# Patient Record
Sex: Female | Born: 1980 | Race: White | Hispanic: No | Marital: Married
Health system: Southern US, Community
[De-identification: ages and names within clinical notes are randomized; demographics above are authoritative.]

---

## 2006-11-14 ENCOUNTER — Emergency Department (HOSPITAL_COMMUNITY): Admission: EM | Admit: 2006-11-14 | Discharge: 2006-11-14 | Payer: Self-pay | Admitting: Emergency Medicine

## 2008-04-21 ENCOUNTER — Emergency Department (HOSPITAL_COMMUNITY): Admission: EM | Admit: 2008-04-21 | Discharge: 2008-04-22 | Payer: Self-pay | Admitting: Emergency Medicine

## 2009-09-03 IMAGING — CT CT HEAD W/O CM
1 series · 16 of 30 positions shown, 20 images · non-contrast
Comparison: 11/14/2006.

CLINICAL DATA: 27-year-old female who fell 3 days ago with positive
loss of consciousness.  Ataxia and headache ever since.

CT HEAD WITHOUT CONTRAST
TECHNIQUE: Contiguous axial images were obtained from the base of
the skull through the vertex without contrast.

[Series 2: headtrauma 4.8 h37s · axial · 0.43mm/px · z∈[+1146,+1276]mm · 16 of 30 slices shown, 20 images]
[im 2/30  brain]
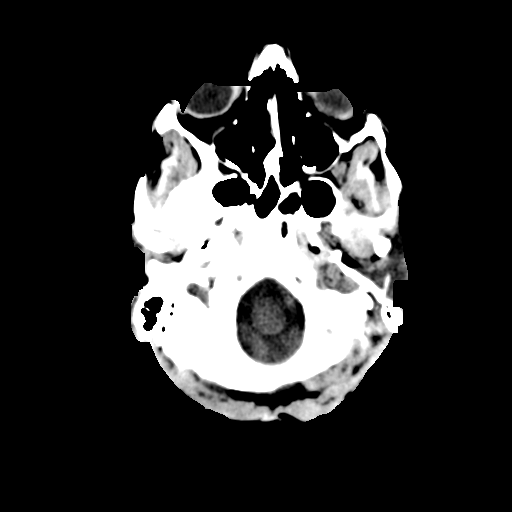
[im 2/30  bone]
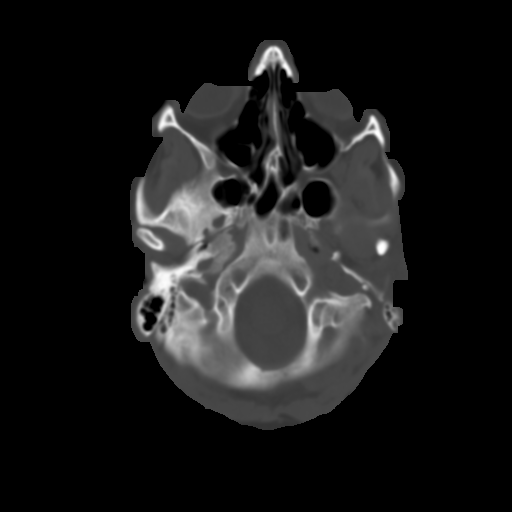
[im 4/30  brain]
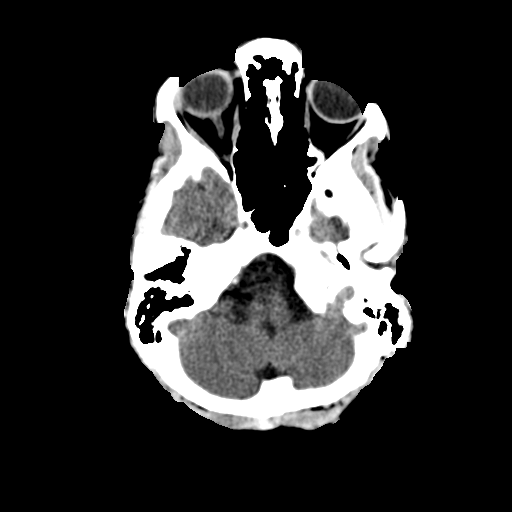
[im 6/30  brain]
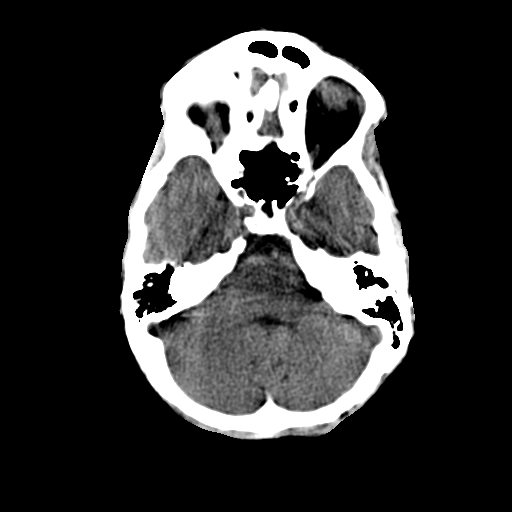
[im 8/30  brain]
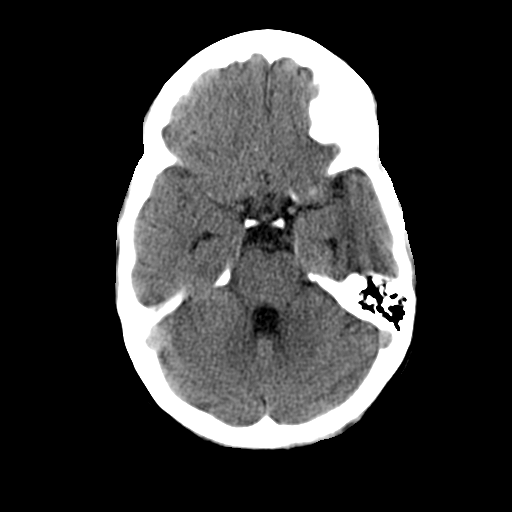
[im 9/30  brain]
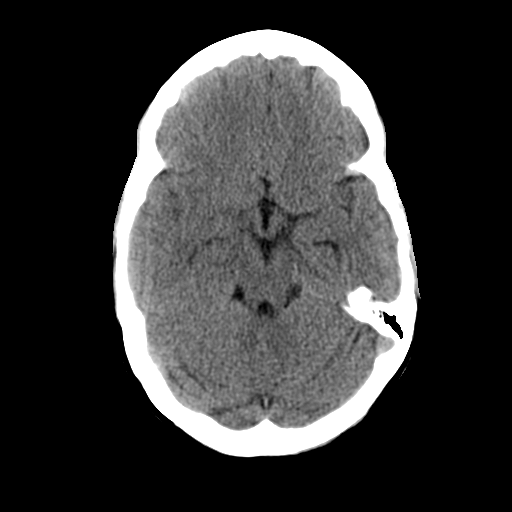
[im 9/30  bone]
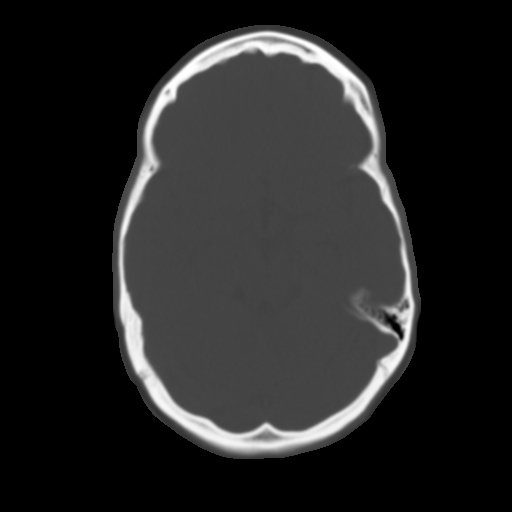
[im 11/30  brain]
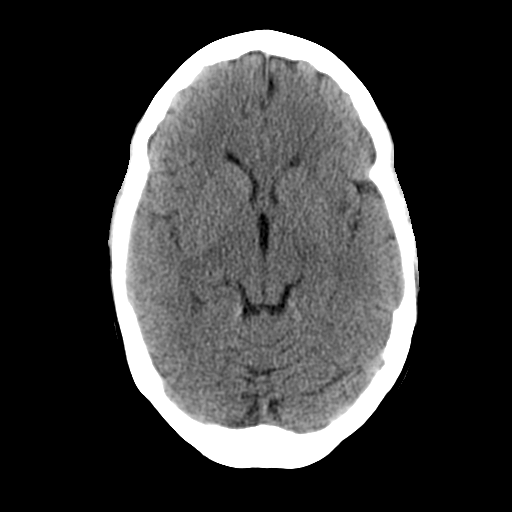
[im 13/30  brain]
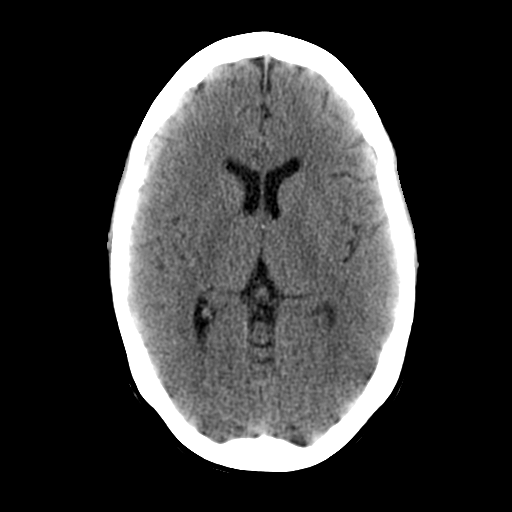
[im 15/30  brain]
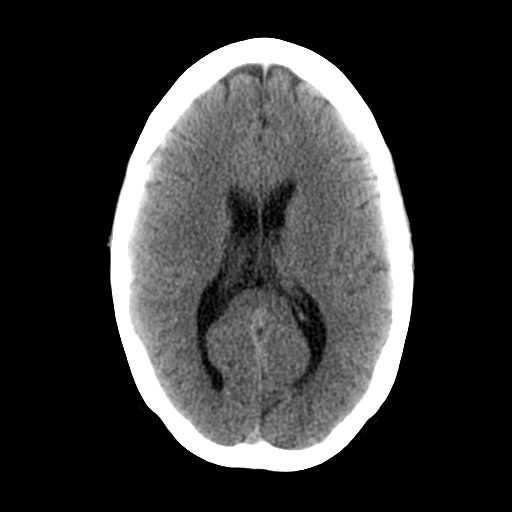
[im 16/30  brain]
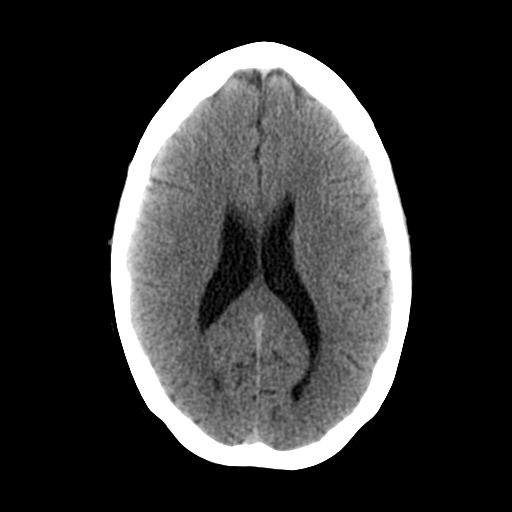
[im 16/30  bone]
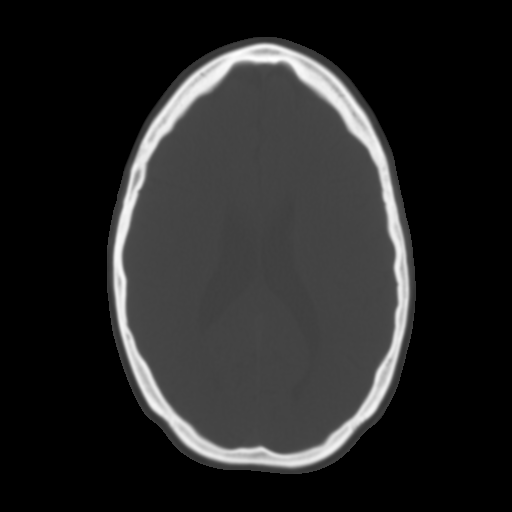
[im 18/30  brain]
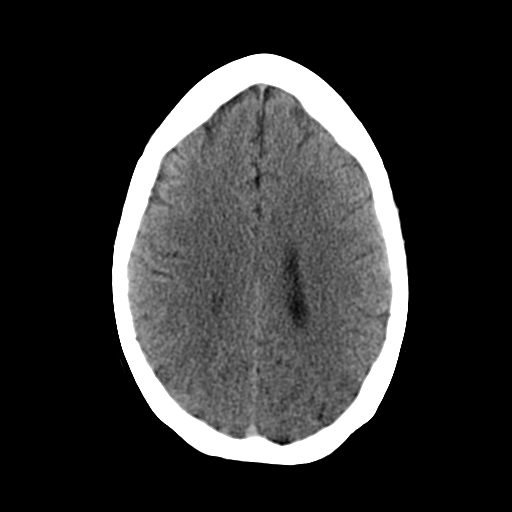
[im 20/30  brain]
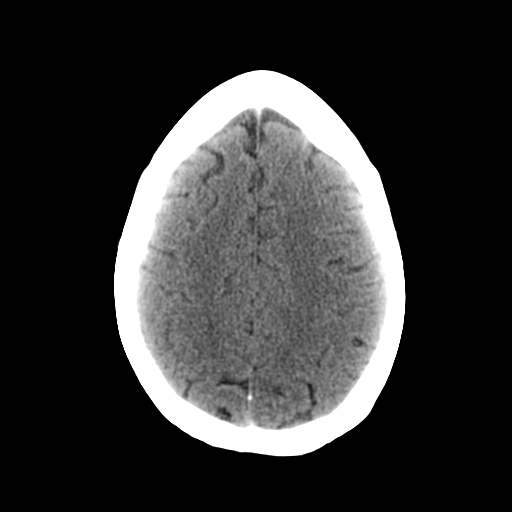
[im 22/30  brain]
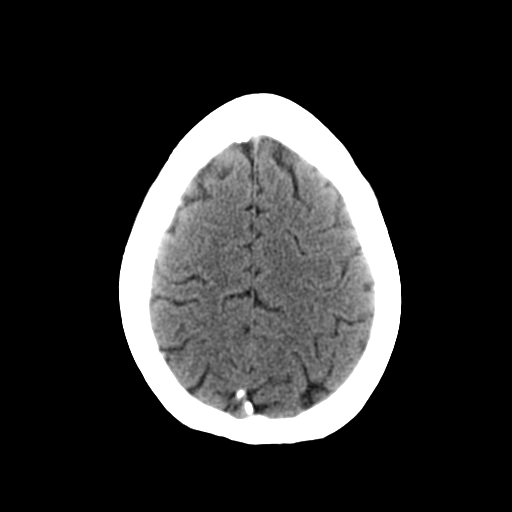
[im 23/30  brain]
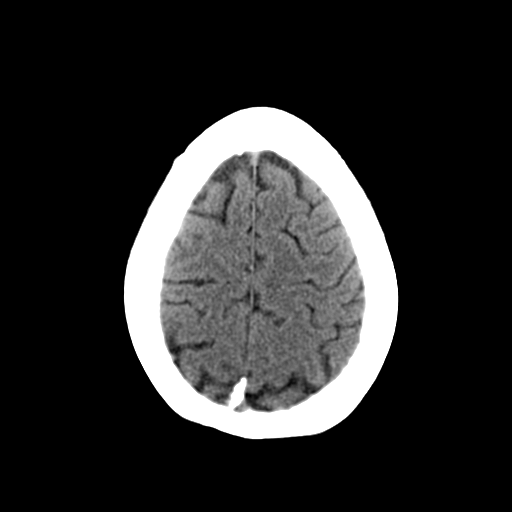
[im 23/30  bone]
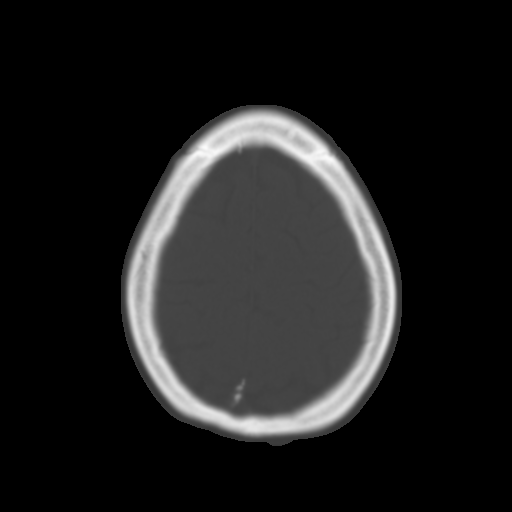
[im 25/30  brain]
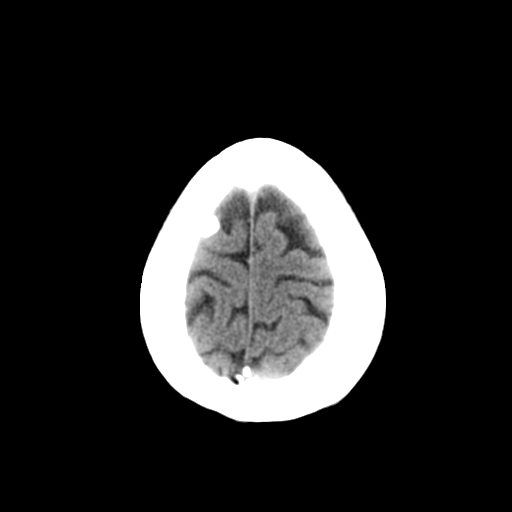
[im 27/30  brain]
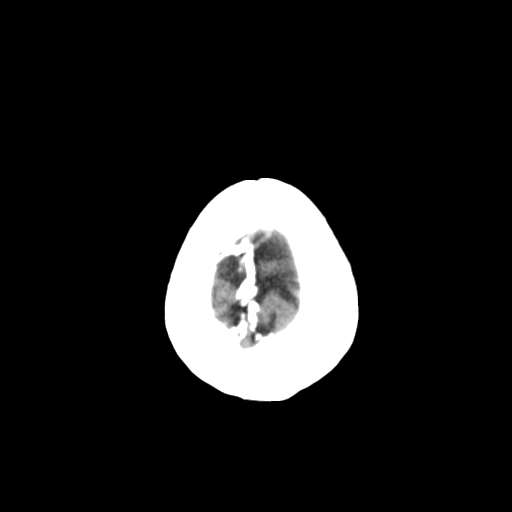
[im 29/30  brain]
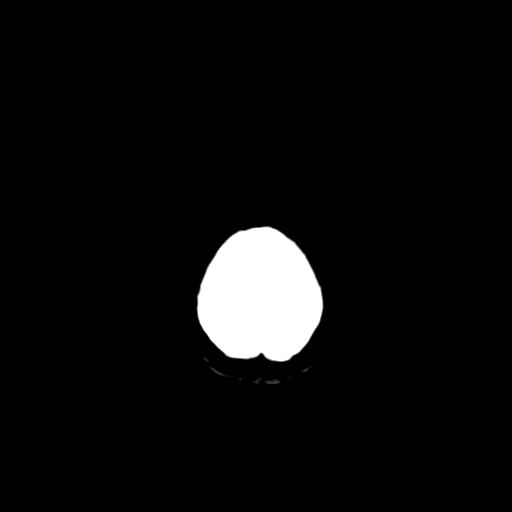

[16 of 30 positions shown; findings below may reference images not displayed]

FINDINGS: Visualized scalp and orbital soft tissues are within
normal limits.  Normal bone mineralization.  Incidental dural
calcifications.  Visualized paranasal sinuses and mastoids are
clear.  No acute osseous injury.

Normal cerebral volume.  No midline shift.  Stable ventricle size
configuration.  No mass effect.  No intracranial hemorrhage or
evidence of acute cortically based infarct identified.  Gray-white
matter differentiation within normal limits throughout the brain.
IMPRESSION: Normal noncontrast appearance of the brain.  No acute traumatic
injury identified.

## 2011-11-26 ENCOUNTER — Other Ambulatory Visit (HOSPITAL_COMMUNITY): Payer: Self-pay | Admitting: Obstetrics and Gynecology

## 2011-11-26 DIAGNOSIS — O280 Abnormal hematological finding on antenatal screening of mother: Secondary | ICD-10-CM

## 2011-12-01 ENCOUNTER — Ambulatory Visit (HOSPITAL_COMMUNITY)
Admission: RE | Admit: 2011-12-01 | Discharge: 2011-12-01 | Disposition: A | Discharge status: Home / Self Care | Payer: PRIVATE HEALTH INSURANCE | Source: Ambulatory Visit | Attending: Obstetrics and Gynecology | Admitting: Obstetrics and Gynecology

## 2011-12-01 ENCOUNTER — Encounter (HOSPITAL_COMMUNITY): Payer: Self-pay

## 2011-12-01 DIAGNOSIS — Z363 Encounter for antenatal screening for malformations: Secondary | ICD-10-CM | POA: Insufficient documentation

## 2011-12-01 DIAGNOSIS — O289 Unspecified abnormal findings on antenatal screening of mother: Secondary | ICD-10-CM | POA: Insufficient documentation

## 2011-12-01 DIAGNOSIS — Z1389 Encounter for screening for other disorder: Secondary | ICD-10-CM | POA: Insufficient documentation

## 2011-12-01 DIAGNOSIS — O280 Abnormal hematological finding on antenatal screening of mother: Secondary | ICD-10-CM

## 2011-12-01 DIAGNOSIS — O358XX0 Maternal care for other (suspected) fetal abnormality and damage, not applicable or unspecified: Secondary | ICD-10-CM | POA: Insufficient documentation

## 2011-12-01 NOTE — Progress Notes (Signed)
Stacey Kramer was seen for ultrasound appointment today.  Please see [facility] report for details.

## 2011-12-13 ENCOUNTER — Other Ambulatory Visit: Payer: Self-pay

## 2014-04-15 ENCOUNTER — Other Ambulatory Visit: Payer: Self-pay

## 2014-04-17 ENCOUNTER — Other Ambulatory Visit (HOSPITAL_COMMUNITY): Payer: Self-pay | Admitting: *Deleted

## 2014-04-17 DIAGNOSIS — Z3689 Encounter for other specified antenatal screening: Secondary | ICD-10-CM

## 2014-04-23 ENCOUNTER — Ambulatory Visit (HOSPITAL_COMMUNITY)
Admission: RE | Admit: 2014-04-23 | Discharge: 2014-04-23 | Disposition: A | Discharge status: Home / Self Care | Payer: PRIVATE HEALTH INSURANCE | Source: Ambulatory Visit | Attending: *Deleted | Admitting: *Deleted

## 2014-04-23 ENCOUNTER — Other Ambulatory Visit (HOSPITAL_COMMUNITY): Payer: Self-pay | Admitting: *Deleted

## 2014-04-23 ENCOUNTER — Encounter (HOSPITAL_COMMUNITY): Payer: Self-pay

## 2014-04-23 DIAGNOSIS — Z3689 Encounter for other specified antenatal screening: Secondary | ICD-10-CM | POA: Insufficient documentation

## 2014-04-24 ENCOUNTER — Other Ambulatory Visit (HOSPITAL_COMMUNITY): Payer: PRIVATE HEALTH INSURANCE

## 2014-10-14 ENCOUNTER — Encounter (HOSPITAL_COMMUNITY): Payer: Self-pay

## 2015-02-26 ENCOUNTER — Encounter (HOSPITAL_COMMUNITY): Payer: Self-pay | Admitting: *Deleted
